# Patient Record
Sex: Male | Born: 2005
Health system: Southern US, Community
[De-identification: ages and names within clinical notes are randomized; demographics above are authoritative.]

## PROBLEM LIST (undated history)

## (undated) DIAGNOSIS — J050 Acute obstructive laryngitis [croup]: Secondary | ICD-10-CM

## (undated) DIAGNOSIS — R112 Nausea with vomiting, unspecified: Secondary | ICD-10-CM

## (undated) DIAGNOSIS — Z9889 Other specified postprocedural states: Secondary | ICD-10-CM

## (undated) DIAGNOSIS — J45909 Unspecified asthma, uncomplicated: Secondary | ICD-10-CM

## (undated) DIAGNOSIS — F419 Anxiety disorder, unspecified: Secondary | ICD-10-CM

## (undated) DIAGNOSIS — T8859XA Other complications of anesthesia, initial encounter: Secondary | ICD-10-CM

## (undated) DIAGNOSIS — R482 Apraxia: Secondary | ICD-10-CM

## (undated) HISTORY — DX: Apraxia: R48.2

## (undated) HISTORY — PX: TONSILLECTOMY: SUR1361

## (undated) HISTORY — PX: ADENOIDECTOMY: SUR15

## (undated) HISTORY — PX: TYMPANOSTOMY TUBE PLACEMENT: SHX32

---
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
## 2024-01-06 ENCOUNTER — Encounter: Payer: Self-pay | Admitting: Sports Medicine
